# Patient Record
Sex: Male | Born: 1975 | Race: Black or African American | Hispanic: No | Marital: Single | Smoking: Current every day smoker
Health system: Southern US, Community
[De-identification: ages and names within clinical notes are randomized; demographics above are authoritative.]

---

## 2016-06-07 ENCOUNTER — Encounter: Payer: Self-pay | Admitting: Emergency Medicine

## 2016-06-07 ENCOUNTER — Emergency Department (INDEPENDENT_AMBULATORY_CARE_PROVIDER_SITE_OTHER)
Admission: EM | Admit: 2016-06-07 | Discharge: 2016-06-07 | Disposition: A | Discharge status: Home / Self Care | Payer: BLUE CROSS/BLUE SHIELD | Source: Home / Self Care | Attending: Family Medicine | Admitting: Family Medicine

## 2016-06-07 DIAGNOSIS — S39012A Strain of muscle, fascia and tendon of lower back, initial encounter: Secondary | ICD-10-CM

## 2016-06-07 MED ORDER — MELOXICAM 7.5 MG PO TABS: 7.5000 mg | ORAL_TABLET | Freq: Every day | ORAL | 0 refills | Status: DC

## 2016-06-07 MED ORDER — TRAMADOL HCL 50 MG PO TABS: 50.0000 mg | ORAL_TABLET | Freq: Four times a day (QID) | ORAL | 0 refills | Status: DC | PRN

## 2016-06-07 MED ORDER — CYCLOBENZAPRINE HCL 10 MG PO TABS: 10.0000 mg | ORAL_TABLET | Freq: Two times a day (BID) | ORAL | 0 refills | Status: DC | PRN

## 2016-06-07 NOTE — ED Triage Notes (Signed)
Reports lifting heavy furniture about 1 week ago and now low back and neck are uncomfortable with certain motions. No OTCs today.

## 2016-06-07 NOTE — ED Provider Notes (Signed)
CSN: [id number]     Arrival date & time 06/07/16  1555 History   None    Chief Complaint  Patient presents with  . Back Pain   (Consider location/radiation/quality/duration/timing/severity/associated sxs/prior Treatment) HPI  Adrian Glenn is a 40 y.o. male presenting to UC with c/o gradually worsening persistent lower back pain that is aching and sharp on occasion with certain movements including rotating at the waist and bending down. Pain is 6/10 at worst. He reports moving furniture recently and believes that's what caused current back pain. Denies numbness or pain in lower extremities or groin. Denies urinary symptoms. Denies fever or chills. No hx of prior back injuries or surgeries.    History reviewed. No pertinent past medical history. History reviewed. No pertinent surgical history. Family History  Problem Relation Age of Onset  . Diabetes Mother   . Diabetes Father   . Hypertension Father    Social History  Substance Use Topics  . Smoking status: Current Every Day Smoker  . Smokeless tobacco: Never Used  . Alcohol use No    Review of Systems  Constitutional: Negative for chills, diaphoresis and fever.  Gastrointestinal: Negative for abdominal pain and nausea.  Genitourinary: Negative for dysuria, flank pain, frequency and hematuria.  Musculoskeletal: Positive for back pain, gait problem and myalgias. Negative for arthralgias.  Skin: Negative for color change and rash.    Allergies  Review of patient's allergies indicates no known allergies.  Home Medications   Prior to Admission medications   Medication Sig Start Date End Date Taking? Authorizing Provider  cyclobenzaprine (FLEXERIL) 10 MG tablet Take 1 tablet (10 mg total) by mouth 2 (two) times daily as needed for muscle spasms. 06/07/16   Junius Finner, PA-C  meloxicam (MOBIC) 7.5 MG tablet Take 1-2 tablets (7.5-15 mg total) by mouth daily. For 7 days, then daily as needed for pain 06/07/16   Junius Finner, PA-C   traMADol (ULTRAM) 50 MG tablet Take 1 tablet (50 mg total) by mouth every 6 (six) hours as needed. 06/07/16   Junius Finner, PA-C   Meds Ordered and Administered this Visit  Medications - No data to display  BP 99/65 (BP Location: Left Arm)   Pulse 75   Temp 98.2 F (36.8 C) (Oral)   Resp 16   Ht  (1.905 m)   Wt 187 lb (84.8 kg)   SpO2 98%   BMI 23.37 kg/m  No data found.   Physical Exam  Constitutional: He is oriented to person, place, and time. He appears well-developed and well-nourished.  HENT:  Head: Normocephalic and atraumatic.  Eyes: EOM are normal.  Neck: Normal range of motion.  Cardiovascular: Normal rate.   Pulmonary/Chest: Effort normal.  Musculoskeletal: Normal range of motion. He exhibits tenderness. He exhibits no edema or deformity.  No midline spinal tenderness. Tenderness to Left upper trapezius and bilateral lower lumbar muscles. Full ROM upper and lower extremities with 5/5 strength bilaterally. Negative straight leg raise.  Neurological: He is alert and oriented to person, place, and time.  Skin: Skin is warm and dry.  Psychiatric: He has a normal mood and affect. His behavior is normal.  Nursing note and vitals reviewed.   Urgent Care Course   Clinical Course    Procedures (including critical care time)  Labs Review Labs Reviewed - No data to display  Imaging Review No results found.    MDM   1. Low back strain, initial encounter    Pt c/o lower back pain.  No red flag symptoms.  Tenderness to muscles.  Will treat as muscle strain.  Home care instructions provided. Rx: Tramadol, flexeril, and meloxicam.  F/u with PCP or Sports Medicine in 1-2 weeks if not improving. Sooner if worsening.    Junius Finner, PA-C 06/07/16 1709

## 2016-06-07 NOTE — Discharge Instructions (Signed)
°  New medication information:   Tramadol is strong pain medication. While taking, do not drink alcohol, drive, or perform any other activities that requires focus while taking these medications.   Flexeril is a muscle relaxer and may cause drowsiness. Do not drink alcohol, drive, or operate heavy machinery while taking.  Meloxicam (Mobic) is an antiinflammatory to help with pain and inflammation.  Do not take ibuprofen, Advil, Aleve, or any other medications that contain NSAIDs while taking meloxicam as this may cause stomach upset or even ulcers if taken in large amounts for an extended period of time.

## 2016-10-14 ENCOUNTER — Encounter: Payer: Self-pay | Admitting: *Deleted

## 2016-10-14 ENCOUNTER — Emergency Department (INDEPENDENT_AMBULATORY_CARE_PROVIDER_SITE_OTHER)
Admission: EM | Admit: 2016-10-14 | Discharge: 2016-10-14 | Disposition: A | Discharge status: Home / Self Care | Payer: BLUE CROSS/BLUE SHIELD | Source: Home / Self Care | Attending: Family Medicine | Admitting: Family Medicine

## 2016-10-14 ENCOUNTER — Emergency Department (INDEPENDENT_AMBULATORY_CARE_PROVIDER_SITE_OTHER): Payer: BLUE CROSS/BLUE SHIELD

## 2016-10-14 DIAGNOSIS — S39012A Strain of muscle, fascia and tendon of lower back, initial encounter: Secondary | ICD-10-CM | POA: Diagnosis not present

## 2016-10-14 DIAGNOSIS — M545 Low back pain: Secondary | ICD-10-CM | POA: Diagnosis not present

## 2016-10-14 MED ORDER — CYCLOBENZAPRINE HCL 10 MG PO TABS: 10.0000 mg | ORAL_TABLET | Freq: Every day | ORAL | 1 refills | Status: AC

## 2016-10-14 MED ORDER — TRAMADOL HCL 50 MG PO TABS: ORAL_TABLET | ORAL | 0 refills | Status: AC

## 2016-10-14 MED ORDER — ACETAMINOPHEN 325 MG PO TABS
650.0000 mg | ORAL_TABLET | Freq: Once | ORAL | Status: AC
  Administered 2016-10-14: 650 mg via ORAL

## 2016-10-14 MED ORDER — MELOXICAM 15 MG PO TABS: 15.0000 mg | ORAL_TABLET | Freq: Every day | ORAL | 1 refills | Status: AC

## 2016-10-14 NOTE — Discharge Instructions (Signed)
Apply ice pack for 20 to 30 minutes, 2 or 3 times daily  Continue until pain decreases, then may apply ice pack at the end of a work day.  Begin back range of motion and stretching exercises.  Begin plank core exercises when tolerated.

## 2016-10-14 NOTE — ED Provider Notes (Signed)
[facility]    CSN: [id number] Arrival date & time: 10/14/16  1010     History   Chief Complaint Chief Complaint  Patient presents with  . Back Pain    HPI Adrian Glenn is a 40 y.o. male.   Patient had an episode of back pain in July 2017 that resolved.  The pain recurred one week ago.  He denies injury but admits that he does a lot of bending over and heavy lifting at work.  His pain does not radiate.   He denies bowel or bladder dysfunction, and no saddle numbness.   The pain is worse when he bends over, and better when standing.   The history is provided by the patient.  Back Pain  Location:  Lumbar spine Quality:  Aching Radiates to:  Does not radiate Pain severity:  Moderate Pain is:  Same all the time Onset quality:  Sudden Duration:  1 week Timing:  Constant Progression:  Unchanged Chronicity:  Recurrent Context: lifting heavy objects   Context: not recent injury   Relieved by:  Nothing Worsened by:  Bending Ineffective treatments:  None tried Associated symptoms: no abdominal pain, no bladder incontinence, no bowel incontinence, no dysuria, no fever, no leg pain, no numbness, no paresthesias, no pelvic pain, no perianal numbness, no tingling, no weakness and no weight loss     History reviewed. No pertinent past medical history.  There are no active problems to display for this patient.   History reviewed. No pertinent surgical history.     Home Medications    Prior to Admission medications   Medication Sig Start Date End Date Taking? Authorizing Provider  cyclobenzaprine (FLEXERIL) 10 MG tablet Take 1 tablet (10 mg total) by mouth at bedtime. 10/14/16   Lattie HawStephen A Caitlynn Ju, MD  meloxicam (MOBIC) 15 MG tablet Take 1 tablet (15 mg total) by mouth daily. Take with food each morning 10/14/16   Lattie HawStephen A Jathan Balling, MD  traMADol Janean Sark(ULTRAM) 50 MG tablet Take one or two tabs at bedtime as needed for pain 10/14/16   Lattie HawStephen A Biannca Scantlin, MD    Family  History Family History  Problem Relation Age of Onset  . Diabetes Mother   . Diabetes Father   . Hypertension Father     Social History Social History  Substance Use Topics  . Smoking status: Current Every Day Smoker  . Smokeless tobacco: Never Used  . Alcohol use No     Allergies   Patient has no known allergies.   Review of Systems Review of Systems  Constitutional: Negative for fever and weight loss.  Gastrointestinal: Negative for abdominal pain and bowel incontinence.  Genitourinary: Negative for bladder incontinence, dysuria and pelvic pain.  Musculoskeletal: Positive for back pain.  Neurological: Negative for tingling, weakness, numbness and paresthesias.  All other systems reviewed and are negative.    Physical Exam Triage Vital Signs ED Triage Vitals  Enc Vitals Group     BP 10/14/16 1032 117/74     Pulse Rate 10/14/16 1032 89     Resp --      Temp --      Temp src --      SpO2 10/14/16 1032 97 %     Weight 10/14/16 1032 183 lb (83 kg)     Height --      Head Circumference --      Peak Flow --      Pain Score 10/14/16 1033 5     Pain  Loc --      Pain Edu? --      Excl. in GC? --    No data found.   Updated Vital Signs BP 117/74 (BP Location: Left Arm)   Pulse 89   Wt 183 lb (83 kg)   SpO2 97%   BMI 22.87 kg/m   Visual Acuity Right Eye Distance:   Left Eye Distance:   Bilateral Distance:    Right Eye Near:   Left Eye Near:    Bilateral Near:     Physical Exam  Constitutional: He appears well-developed and well-nourished. No distress.  HENT:  Head: Normocephalic.  Right Ear: External ear normal.  Left Ear: External ear normal.  Nose: Nose normal.  Mouth/Throat: Oropharynx is clear and moist.  Eyes: Pupils are equal, round, and reactive to light.  Neck: Normal range of motion.  Cardiovascular: Normal heart sounds.   Pulmonary/Chest: Breath sounds normal.  Abdominal: There is no tenderness.  Musculoskeletal: He exhibits no  edema.       Lumbar back: He exhibits decreased range of motion, tenderness and bony tenderness. He exhibits no swelling and no edema.       Back:  Back:  Can heel/toe walk and squat without difficulty.  Decreased forward flexion.  Tenderness in the midline and bilateral paraspinous muscles from L3 to Sacral area.  Straight leg raising test is negative.  Sitting knee extension test is negative.  Strength and sensation in the lower extremities is normal.  Patellar and achilles reflexes are normal   Neurological: He is alert.  Skin: Skin is warm and dry. No rash noted.  Nursing note and vitals reviewed.    UC Treatments / Results  Labs (all labs ordered are listed, but only abnormal results are displayed) Labs Reviewed - No data to display  EKG  EKG Interpretation None       Radiology Dg Lumbar Spine Complete  Result Date: 10/14/2016 CLINICAL DATA:  Low back pain for 1 week.  No known injury. EXAM: LUMBAR SPINE - COMPLETE 4+ VIEW COMPARISON:  None. FINDINGS: There is no evidence of lumbar spine fracture. Alignment is normal. Intervertebral disc spaces are maintained. No evidence of facet arthropathy or focal bone lesions. IMPRESSION: Negative. Electronically Signed   By: Myles RosenthalJohn  Stahl M.D.   On: 10/14/2016 11:58    Procedures Procedures (including critical care time)  Medications Ordered in UC Medications  acetaminophen (TYLENOL) tablet 650 mg (650 mg Oral Given 10/14/16 1040)     Initial Impression / Assessment and Plan / UC Course  I have reviewed the triage vital signs and the nursing notes.  Pertinent labs & imaging results that were available during my care of the patient were reviewed by me and considered in my medical decision making (see chart for details).  Clinical Course   Begin Mobic 15mg  QAM, Flexeril 10mg  HS, and Tramadol 50mg , one or two at bedtime. Apply ice pack for 20 to 30 minutes, 2 or 3 times daily  Continue until pain decreases, then may apply ice pack  at the end of a work day.  Begin back range of motion and stretching exercises.  Begin plank core exercises when tolerated. Followup with Dr. Rodney Langtonhomas Thekkekandam or Dr. Clementeen GrahamEvan Corey ([facility]) if not improving about two weeks.     Final Clinical Impressions(s) / UC Diagnoses   Final diagnoses:  Strain of lumbar region, initial encounter    New Prescriptions New Prescriptions   CYCLOBENZAPRINE (FLEXERIL) 10 MG TABLET  Take 1 tablet (10 mg total) by mouth at bedtime.   MELOXICAM (MOBIC) 15 MG TABLET    Take 1 tablet (15 mg total) by mouth daily. Take with food each morning   TRAMADOL (ULTRAM) 50 MG TABLET    Take one or two tabs at bedtime as needed for pain     Lattie Haw, MD 10/21/16 1401

## 2016-10-14 NOTE — ED Triage Notes (Signed)
Patient was seen in July 2017 for low back pain/ Reports pain improved but returned 1 week ago. Pain is in mid and lower back. No otc meds taken.

## 2017-10-12 IMAGING — DX DG LUMBAR SPINE COMPLETE 4+V
5 series · 5 of 5 positions shown · non-contrast
Comparison: None.

CLINICAL DATA: Low back pain for 1 week.  No known injury.

EXAM:
LUMBAR SPINE - COMPLETE 4+ VIEW

[l-spine ap]
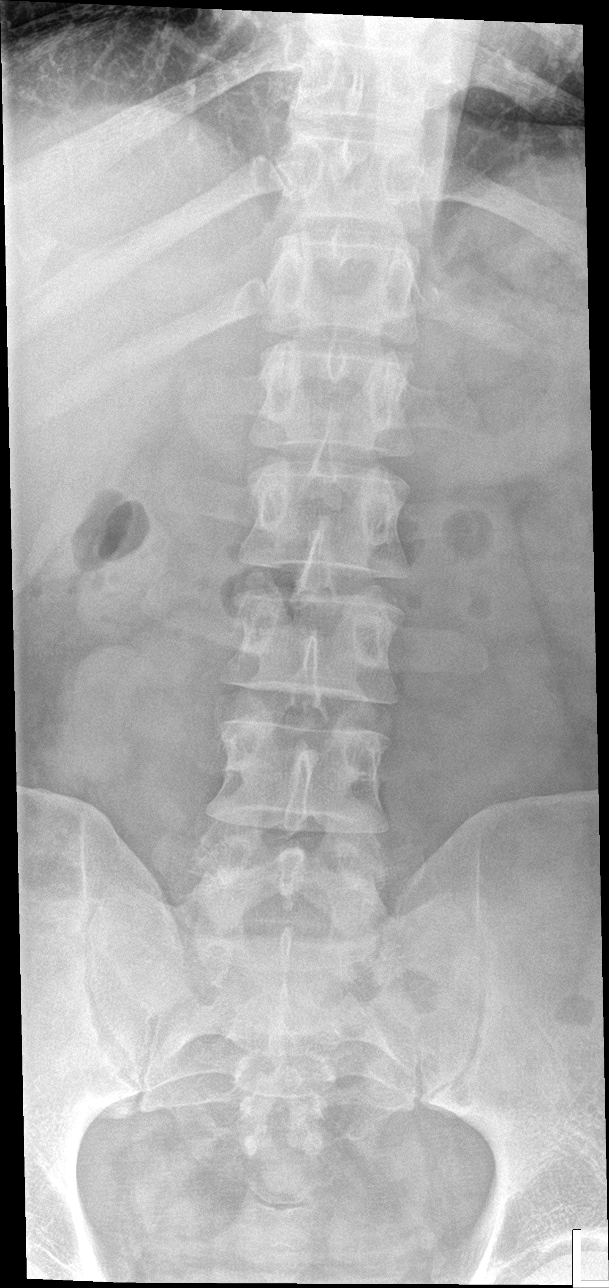

[l-spine obl (1 of 2)]
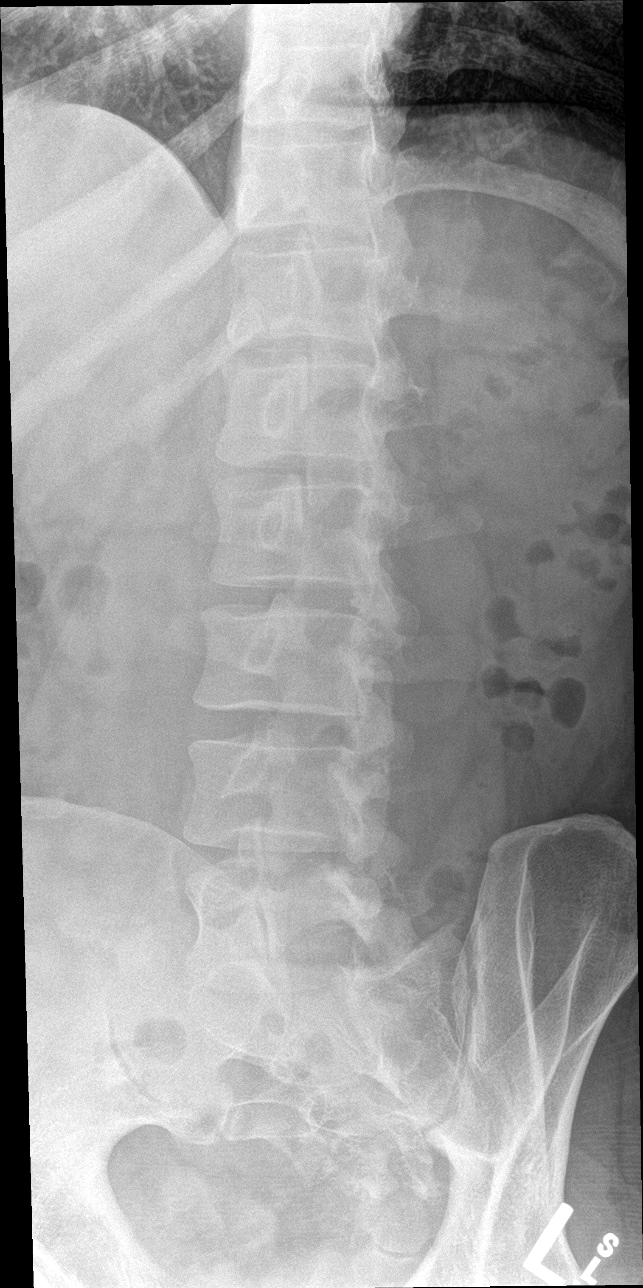

[l-spine obl (2 of 2)]
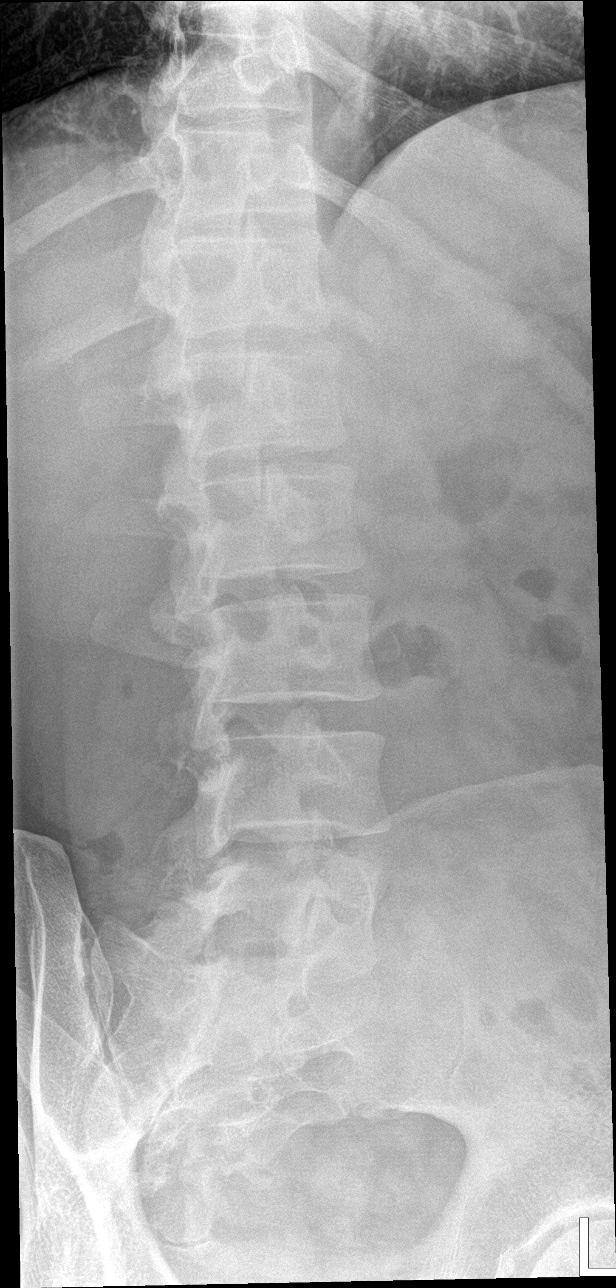

[l-spine lat]
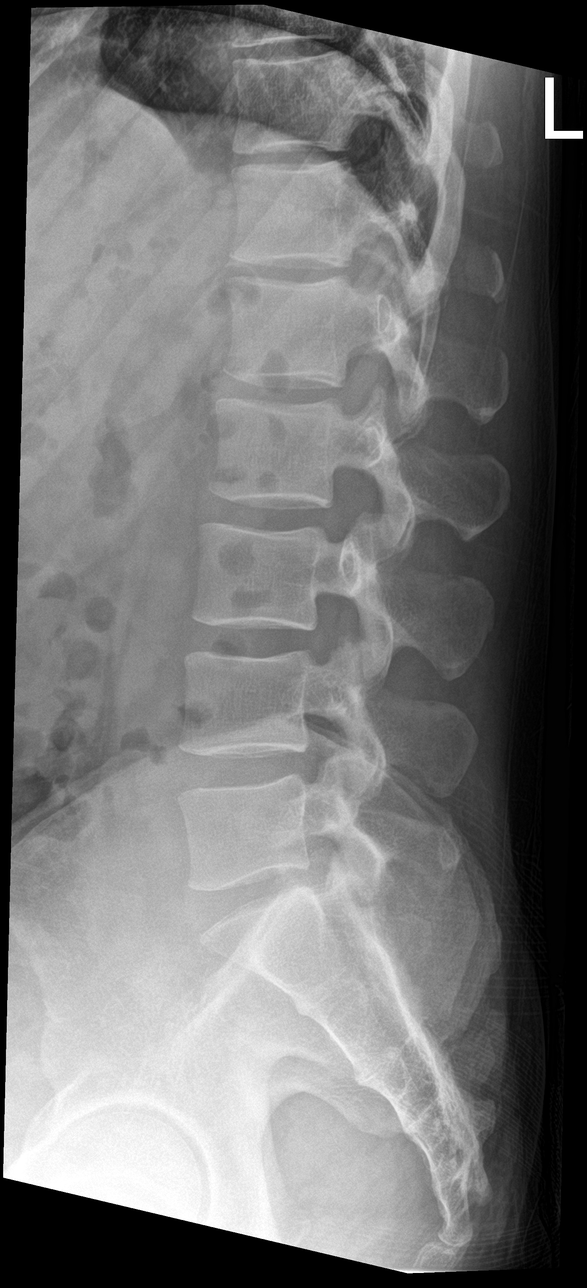

[l-spine spot]
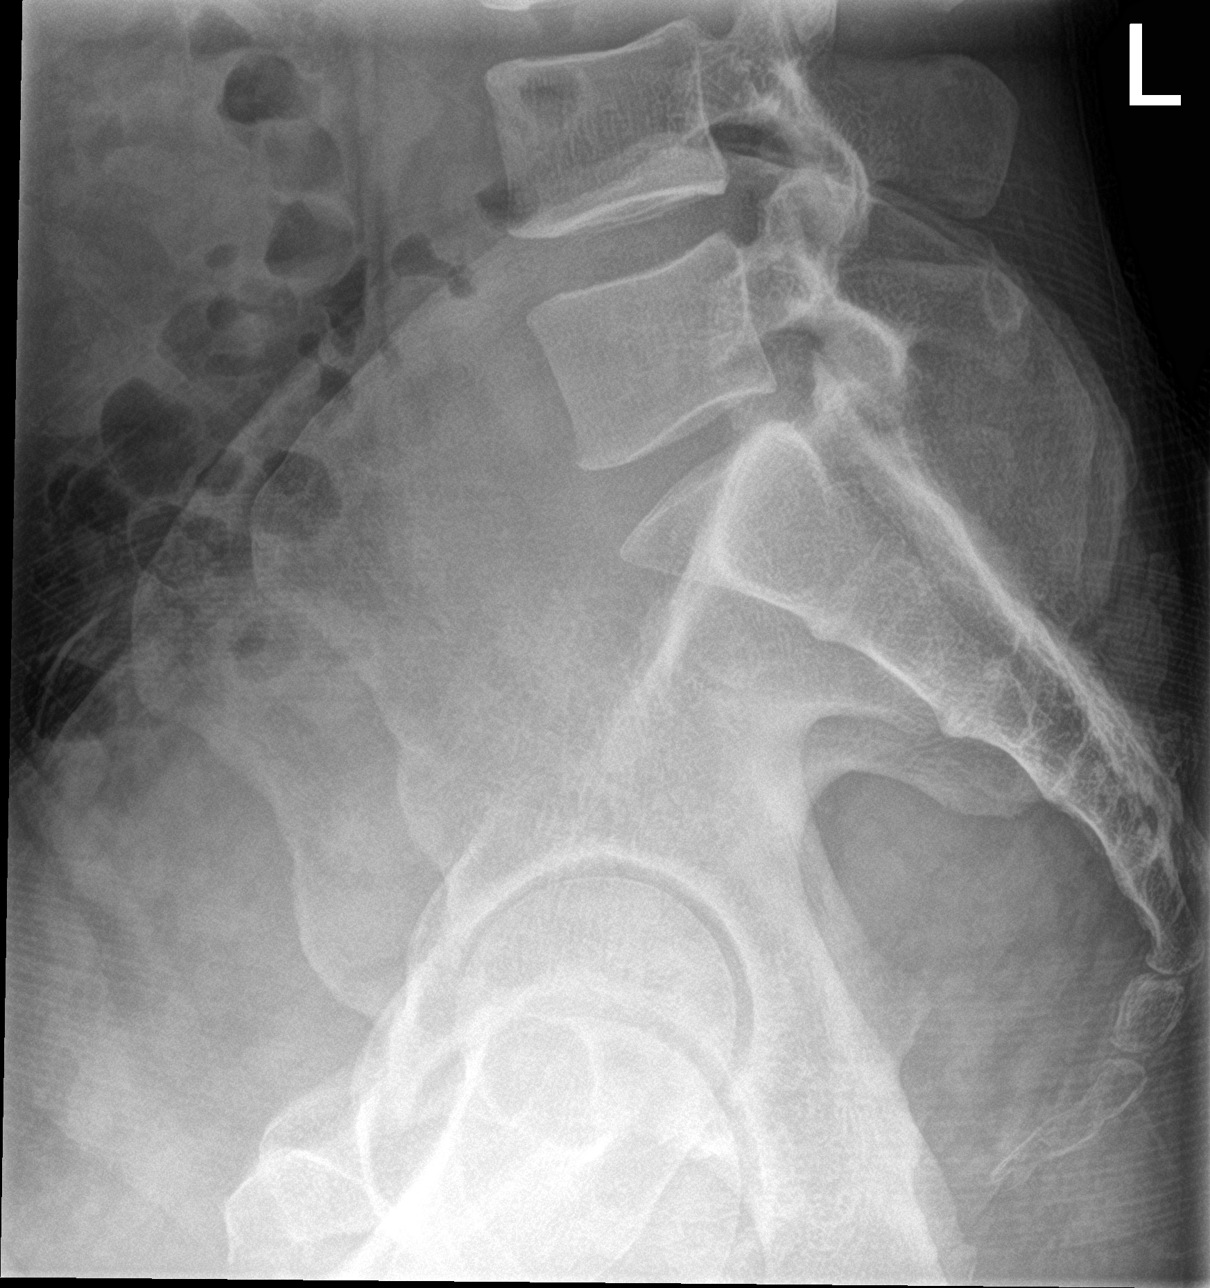

[5 of 5 positions shown; findings below may reference images not displayed]

FINDINGS: There is no evidence of lumbar spine fracture. Alignment is normal.
Intervertebral disc spaces are maintained. No evidence of facet
arthropathy or focal bone lesions.
IMPRESSION: Negative.
